# Patient Record
Sex: Male | Born: 2000 | Race: Black or African American | Hispanic: No | Marital: Single | Smoking: Never smoker
Health system: Southern US, Community
[De-identification: ages and names within clinical notes are randomized; demographics above are authoritative.]

## PROBLEM LIST (undated history)

## (undated) DIAGNOSIS — J45909 Unspecified asthma, uncomplicated: Secondary | ICD-10-CM

## (undated) HISTORY — PX: KNEE SURGERY: SHX244

---
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
## 2023-06-18 ENCOUNTER — Other Ambulatory Visit: Payer: Self-pay

## 2023-06-18 ENCOUNTER — Emergency Department (HOSPITAL_COMMUNITY)
Admission: EM | Admit: 2023-06-18 | Discharge: 2023-06-18 | Disposition: A | Discharge status: Home / Self Care | Payer: 59 | Attending: Student | Admitting: Student

## 2023-06-18 ENCOUNTER — Encounter (HOSPITAL_COMMUNITY): Payer: Self-pay

## 2023-06-18 DIAGNOSIS — W25XXXA Contact with sharp glass, initial encounter: Secondary | ICD-10-CM | POA: Diagnosis not present

## 2023-06-18 DIAGNOSIS — S61216A Laceration without foreign body of right little finger without damage to nail, initial encounter: Secondary | ICD-10-CM | POA: Insufficient documentation

## 2023-06-18 DIAGNOSIS — S6991XA Unspecified injury of right wrist, hand and finger(s), initial encounter: Secondary | ICD-10-CM | POA: Diagnosis present

## 2023-06-18 NOTE — ED Provider Notes (Signed)
[place] EMERGENCY DEPARTMENT AT [facility] Provider Note   CSN: [id number] Arrival date & time: 06/18/23  2101     History  Chief Complaint  Patient presents with   Laceration    Tim Hanson is a 22 y.o. male.  Patient presents to the emergency department complaining of laceration to the posterior right little finger.  Patient states he was pushing down some trash in his trash can, trying to compact it when he hit a piece of broken glass with his finger.  There are 2 small lacerations to the posterior proximal phalanx of the little finger.  Bleeding is controlled upon arrival.  The patient states he washed the wound with soap and water prior to coming to the hospital.  He states his tetanus is up-to-date.  He is right-hand dominant.  Past medical history significant for asthma.  HPI     Home Medications Prior to Admission medications   Not on File      Allergies    Amoxicillin    Review of Systems   Review of Systems  Physical Exam Updated Vital Signs BP 115/61 (BP Location: Left Arm)   Pulse 64   Temp 98.4 F (36.9 C) (Oral)   Resp 16   Ht 6\' 1"  (1.854 m)   Wt 75.8 kg   SpO2 100%   BMI 22.03 kg/m  Physical Exam Vitals and nursing note reviewed.  HENT:     Head: Normocephalic and atraumatic.  Eyes:     Pupils: Pupils are equal, round, and reactive to light.  Pulmonary:     Effort: Pulmonary effort is normal. No respiratory distress.  Musculoskeletal:        General: No signs of injury. Normal range of motion.     Cervical back: Normal range of motion.     Comments: Patient with normal active ROM of left little finger, no sensation loss, brisk cap refill  Skin:    General: Skin is dry.     Comments: 2 separate 1cm lacerations to posterior proximal phalanx of right little finger  Neurological:     Mental Status: He is alert.  Psychiatric:        Speech: Speech normal.        Behavior: Behavior normal.     ED Results / Procedures /  Treatments   Labs (all labs ordered are listed, but only abnormal results are displayed) Labs Reviewed - No data to display  EKG None  Radiology No results found.  Procedures .Marland KitchenLaceration Repair  Date/Time: 06/18/2023 11:19 PM  Performed by: Darrick Grinder, PA-C Authorized by: Darrick Grinder, PA-C   Consent:    Consent obtained:  Verbal   Consent given by:  Patient   Risks, benefits, and alternatives were discussed: yes     Risks discussed:  Poor wound healing, poor cosmetic result, pain, need for additional repair, nerve damage and infection Universal protocol:    Patient identity confirmed:  Verbally with patient Anesthesia:    Anesthesia method:  None Laceration details:    Location:  Finger   Finger location:  R small finger   Length (cm):  1   Depth (mm):  3 Treatment:    Area cleansed with:  Saline   Amount of cleaning:  Standard Skin repair:    Repair method:  Tissue adhesive Approximation:    Approximation:  Close Repair type:    Repair type:  Simple Post-procedure details:    Dressing:  Non-adherent dressing  Procedure completion:  Tolerated well, no immediate complications .Marland KitchenLaceration Repair  Date/Time: 06/18/2023 11:19 PM  Performed by: Darrick Grinder, PA-C Authorized by: Darrick Grinder, PA-C   Consent:    Consent obtained:  Verbal   Consent given by:  Patient   Risks, benefits, and alternatives were discussed: yes     Risks discussed:  Infection, pain, need for additional repair, poor cosmetic result, poor wound healing and nerve damage Universal protocol:    Patient identity confirmed:  Verbally with patient Anesthesia:    Anesthesia method:  None Laceration details:    Location:  Finger   Finger location:  R small finger   Length (cm):  1   Depth (mm):  3 Treatment:    Area cleansed with:  Saline   Amount of cleaning:  Standard Skin repair:    Repair method:  Tissue adhesive Approximation:    Approximation:  Close Repair  type:    Repair type:  Simple Post-procedure details:    Dressing:  Non-adherent dressing   Procedure completion:  Tolerated well, no immediate complications     Medications Ordered in ED Medications - No data to display  ED Course/ Medical Decision Making/ A&P                             Medical Decision Making  Patient presents to the emergency room with a chief complaint of lacerations.  The patient has full normal range of motion of the small finger of the right hand.  I see no indication at this time for imaging.  Very low clinical suspicion of foreign body.  Patient's lacerations were cleaned with saline and closed with surgical adhesive as noted in procedure notes. Patient provided with wound care instructions. No follow up is required. Return precautions provided.         Final Clinical Impression(s) / ED Diagnoses Final diagnoses:  Laceration of right little finger without foreign body without damage to nail, initial encounter    Rx / DC Orders ED Discharge Orders     None         Pamala Duffel 06/18/23 2339    Glendora Score, MD 06/19/23 1249

## 2023-06-18 NOTE — Discharge Instructions (Signed)
You were seen tonight for laceration repair of the small finger of your right hand.  The lacerations were repaired with surgical glue.  You should allow this to fall off on its own over the next days to weeks.  Please avoid getting the wound wet until tomorrow evening.  After that time you may get the wound wet.  If you notice any signs of infection please seek reevaluation.

## 2023-06-18 NOTE — ED Triage Notes (Signed)
Pt cut the proximal joint of the pinky finger on the rt hand on a glass in the trashcan tonight.  Bleeding is controlled. 1 cm lateral laceration.well approximated.

## 2024-06-29 ENCOUNTER — Emergency Department (HOSPITAL_COMMUNITY)
Admission: EM | Admit: 2024-06-29 | Discharge: 2024-06-29 | Disposition: A | Discharge status: Home / Self Care | Attending: Emergency Medicine | Admitting: Emergency Medicine

## 2024-06-29 ENCOUNTER — Other Ambulatory Visit: Payer: Self-pay

## 2024-06-29 ENCOUNTER — Encounter (HOSPITAL_COMMUNITY): Payer: Self-pay | Admitting: *Deleted

## 2024-06-29 DIAGNOSIS — Z202 Contact with and (suspected) exposure to infections with a predominantly sexual mode of transmission: Secondary | ICD-10-CM | POA: Insufficient documentation

## 2024-06-29 DIAGNOSIS — R8271 Bacteriuria: Secondary | ICD-10-CM | POA: Insufficient documentation

## 2024-06-29 DIAGNOSIS — Z711 Person with feared health complaint in whom no diagnosis is made: Secondary | ICD-10-CM

## 2024-06-29 LAB — URINALYSIS, ROUTINE W REFLEX MICROSCOPIC
Bilirubin Urine: NEGATIVE
Glucose, UA: NEGATIVE mg/dL
Hgb urine dipstick: NEGATIVE
Ketones, ur: 5 mg/dL — AB
Nitrite: NEGATIVE
Protein, ur: NEGATIVE mg/dL
Specific Gravity, Urine: 1.021 (ref 1.005–1.030)
pH: 6 (ref 5.0–8.0)

## 2024-06-29 LAB — HIV ANTIBODY (ROUTINE TESTING W REFLEX): HIV Screen 4th Generation wRfx: NONREACTIVE

## 2024-06-29 NOTE — ED Provider Notes (Signed)
  [place] EMERGENCY DEPARTMENT AT [facility] Provider Note   CSN: [id number] Arrival date & time: 06/29/24  2025     Patient presents with: Exposure to STD   Tim Hanson is a 23 y.o. male.  {Add pertinent medical, surgical, social history, OB history to HPI:32947} HPI     Prior to Admission medications   Not on File    Allergies: Amoxicillin    Review of Systems  Updated Vital Signs BP 136/67 (BP Location: Right Arm)   Pulse 89   Temp 98.4 F (36.9 C) (Oral)   Resp 16   Ht 6' 1 (1.854 m)   SpO2 99%   BMI 22.03 kg/m   Physical Exam  (all labs ordered are listed, but only abnormal results are displayed) Labs Reviewed  URINALYSIS, ROUTINE W REFLEX MICROSCOPIC - Abnormal; Notable for the following components:      Result Value   Ketones, ur 5 (*)    Leukocytes,Ua SMALL (*)    Bacteria, UA FEW (*)    All other components within normal limits  URINE CULTURE  RPR  HIV ANTIBODY (ROUTINE TESTING W REFLEX)  GC/CHLAMYDIA PROBE AMP ([place]) NOT AT [facility]    EKG: None  Radiology: No results found.  {Document cardiac monitor, telemetry assessment procedure when appropriate:32947} Procedures   Medications Ordered in the ED - No data to display    {Click here for ABCD2, HEART and other calculators REFRESH Note before signing:1}                              Medical Decision Making Amount and/or Complexity of Data Reviewed Labs: ordered.   ***  {Document critical care time when appropriate  Document review of labs and clinical decision tools ie CHADS2VASC2, etc  Document your independent review of radiology images and any outside records  Document your discussion with family members, caretakers and with consultants  Document social determinants of health affecting pt's care  Document your decision making why or why not admission, treatments were needed:32947:::1}   Final diagnoses:  None    ED Discharge Orders     None

## 2024-06-29 NOTE — ED Triage Notes (Signed)
 The pt has no symptoms but he just wants to be tested for a std

## 2024-06-30 LAB — RPR: RPR Ser Ql: NONREACTIVE

## 2024-07-01 LAB — URINE CULTURE: Culture: NO GROWTH

## 2024-07-01 LAB — GC/CHLAMYDIA PROBE AMP (~~LOC~~) NOT AT ARMC
Chlamydia: POSITIVE — AB
Comment: NEGATIVE
Comment: NORMAL
Neisseria Gonorrhea: NEGATIVE

## 2024-07-02 ENCOUNTER — Encounter (HOSPITAL_COMMUNITY): Payer: Self-pay

## 2024-07-02 ENCOUNTER — Ambulatory Visit (HOSPITAL_COMMUNITY)
Admission: EM | Admit: 2024-07-02 | Discharge: 2024-07-02 | Disposition: A | Discharge status: Home / Self Care | Attending: Emergency Medicine | Admitting: Emergency Medicine

## 2024-07-02 DIAGNOSIS — A749 Chlamydial infection, unspecified: Secondary | ICD-10-CM

## 2024-07-02 MED ORDER — DOXYCYCLINE HYCLATE 100 MG PO CAPS: 100.0000 mg | ORAL_CAPSULE | Freq: Two times a day (BID) | ORAL | 0 refills | Status: AC

## 2024-07-02 NOTE — ED Triage Notes (Signed)
 Patient states he tested positive for chlamydia and would like to be treated.

## 2024-07-02 NOTE — ED Provider Notes (Signed)
 [facility]    CSN: [id number] Arrival date & time: 07/02/24  1151      History   Chief Complaint Chief Complaint  Patient presents with   Exposure to STD    HPI Tim Hanson is a 23 y.o. male.  Went to the ED 3 days ago on 8/2.  He was having some penile discharge and wanted STD testing. Yesterday his results returned and he is positive for chlamydia. He is seeking treatment  Past Medical History:  Diagnosis Date   Asthma     There are no active problems to display for this patient.   History reviewed. No pertinent surgical history.     Home Medications    Prior to Admission medications   Medication Sig Start Date End Date Taking? Authorizing Provider  doxycycline  (VIBRAMYCIN ) 100 MG capsule Take 1 capsule (100 mg total) by mouth 2 (two) times daily for 7 days. 07/02/24 07/09/24 Yes Momina Hunton, [place] RIGGERS    Family History History reviewed. No pertinent family history.  Social History Social History   Tobacco Use   Smoking status: Never    Passive exposure: Never   Smokeless tobacco: Never  Vaping Use   Vaping status: Never Used  Substance Use Topics   Alcohol use: Yes   Drug use: Yes    Types: Marijuana     Allergies   Amoxicillin   Review of Systems Review of Systems  As per HPI  Physical Exam Triage Vital Signs ED Triage Vitals  Encounter Vitals Group     BP      Girls Systolic BP Percentile      Girls Diastolic BP Percentile      Boys Systolic BP Percentile      Boys Diastolic BP Percentile      Pulse      Resp      Temp      Temp src      SpO2      Weight      Height      Head Circumference      Peak Flow      Pain Score      Pain Loc      Pain Education      Exclude from Growth Chart    No data found.  Updated Vital Signs BP 128/74 (BP Location: Left Arm)   Pulse (!) 108   Temp 98.3 F (36.8 C) (Oral)   Resp 16   SpO2 98%    Physical Exam Vitals and nursing note reviewed.  Constitutional:       General: He is not in acute distress.    Appearance: Normal appearance.  HENT:     Mouth/Throat:     Pharynx: Oropharynx is clear.  Cardiovascular:     Rate and Rhythm: Normal rate and regular rhythm.     Heart sounds: Normal heart sounds.  Pulmonary:     Effort: Pulmonary effort is normal.     Breath sounds: Normal breath sounds.  Neurological:     Mental Status: He is alert and oriented to person, place, and time.     UC Treatments / Results  Labs (all labs ordered are listed, but only abnormal results are displayed) Labs Reviewed - No data to display  EKG  Radiology No results found.  Procedures Procedures   Medications Ordered in UC Medications - No data to display  Initial Impression / Assessment and Plan / UC Course  I have reviewed the  triage vital signs and the nursing notes.  Pertinent labs & imaging results that were available during my care of the patient were reviewed by me and considered in my medical decision making (see chart for details).  Positive chlamydia test Discussed treatment with doxycycline  BID x 7 days. Safe sex practices, abstaining from intercourse for 7 days after finishing treatment. Discussed no re-testing is needed as long as he follows these instructions. Patient verbalizes understanding   Final Clinical Impressions(s) / UC Diagnoses   Final diagnoses:  Chlamydia     Discharge Instructions      As discussed, chlamydia is treated with an antibiotic called doxycycline  You will need to take 1 pill, 2 times daily, for 7 days in a row Finish ALL of the pills - you should not have any left over! Take with food to avoid upset stomach.  Abstain from intercourse for a full 7 days after finishing treatment. This is to make sure you do not spread the infection or get reinfected yourself.  There is no re-testing needed if you follow these instructions.      ED Prescriptions     Medication Sig Dispense Auth. Provider    doxycycline  (VIBRAMYCIN ) 100 MG capsule Take 1 capsule (100 mg total) by mouth 2 (two) times daily for 7 days. 14 capsule Daliah Chaudoin, [place], PA-C      PDMP not reviewed this encounter.   Cherryl Babin, [place], [place] 07/02/24 1226

## 2024-07-02 NOTE — Discharge Instructions (Addendum)
 As discussed, chlamydia is treated with an antibiotic called doxycycline  You will need to take 1 pill, 2 times daily, for 7 days in a row Finish ALL of the pills - you should not have any left over! Take with food to avoid upset stomach.  Abstain from intercourse for a full 7 days after finishing treatment. This is to make sure you do not spread the infection or get reinfected yourself.  There is no re-testing needed if you follow these instructions.

## 2024-07-03 ENCOUNTER — Ambulatory Visit (HOSPITAL_COMMUNITY): Payer: Self-pay

## 2024-07-30 ENCOUNTER — Encounter (HOSPITAL_COMMUNITY): Payer: Self-pay

## 2024-07-30 ENCOUNTER — Ambulatory Visit (HOSPITAL_COMMUNITY): Admission: EM | Admit: 2024-07-30 | Discharge: 2024-07-30 | Disposition: A | Discharge status: Home / Self Care

## 2024-07-30 NOTE — ED Notes (Signed)
 Pt states he was tested and treated for chlamydia on 07/02/2024, he wants to make sure he is neg now.

## 2024-07-30 NOTE — ED Triage Notes (Signed)
 Pt states wants routine STD check with blood work. Denies sx's.

## 2024-07-30 NOTE — ED Notes (Signed)
 Pt decided to leave and come back next week when it has been 4 week from last dose of meds.

## 2024-08-14 ENCOUNTER — Other Ambulatory Visit: Payer: Self-pay

## 2024-08-14 ENCOUNTER — Encounter (HOSPITAL_COMMUNITY): Payer: Self-pay | Admitting: Emergency Medicine

## 2024-08-14 ENCOUNTER — Ambulatory Visit (HOSPITAL_COMMUNITY): Admission: EM | Admit: 2024-08-14 | Discharge: 2024-08-14 | Discharge status: Against Medical Advice

## 2024-08-14 NOTE — ED Triage Notes (Signed)
 Patient is in department for a check up denies any symptoms

## 2024-08-15 ENCOUNTER — Ambulatory Visit (HOSPITAL_COMMUNITY)
Admission: EM | Admit: 2024-08-15 | Discharge: 2024-08-15 | Disposition: A | Discharge status: Home / Self Care | Attending: Emergency Medicine | Admitting: Emergency Medicine

## 2024-08-15 ENCOUNTER — Encounter (HOSPITAL_COMMUNITY): Payer: Self-pay | Admitting: Emergency Medicine

## 2024-08-15 ENCOUNTER — Ambulatory Visit (HOSPITAL_COMMUNITY)

## 2024-08-15 DIAGNOSIS — Z113 Encounter for screening for infections with a predominantly sexual mode of transmission: Secondary | ICD-10-CM | POA: Diagnosis present

## 2024-08-15 LAB — HIV ANTIBODY (ROUTINE TESTING W REFLEX): HIV Screen 4th Generation wRfx: NONREACTIVE

## 2024-08-15 NOTE — Discharge Instructions (Signed)
 Your results will come back over the next few days and someone will call if results are positive and require treatment.  Return here as needed.

## 2024-08-15 NOTE — ED Provider Notes (Signed)
 [facility]    CSN: [id number] Arrival date & time: 08/15/24  1017      History   Chief Complaint Chief Complaint  Patient presents with   appt 1030    HPI Tim Hanson is a 23 y.o. male.   Patient presents requesting STD testing.  Patient reports that he was recently treated for chlamydia and completed treatment about 4 weeks ago would like retesting for this.  Patient is also requesting repeat HIV and syphilis testing.  Patient denies any recent known exposures to STDs.  Patient denies any penile discharge, dysuria, hematuria, urinary frequency/urgency, penile/testicular pain or swelling, and penile lesions.  The history is provided by the patient and medical records.    Past Medical History:  Diagnosis Date   Asthma     There are no active problems to display for this patient.   Past Surgical History:  Procedure Laterality Date   KNEE SURGERY         Home Medications    Prior to Admission medications   Not on File    Family History No family history on file.  Social History Social History   Tobacco Use   Smoking status: Never    Passive exposure: Never   Smokeless tobacco: Never  Vaping Use   Vaping status: Never Used  Substance Use Topics   Alcohol use: Yes   Drug use: Yes    Types: Marijuana     Allergies   Amoxicillin   Review of Systems Review of Systems  Per HPI  Physical Exam Triage Vital Signs ED Triage Vitals [08/15/24 1040]  Encounter Vitals Group     BP (!) 95/59     Girls Systolic BP Percentile      Girls Diastolic BP Percentile      Boys Systolic BP Percentile      Boys Diastolic BP Percentile      Pulse Rate 60     Resp 14     Temp 98 F (36.7 C)     Temp Source Oral     SpO2 98 %     Weight      Height      Head Circumference      Peak Flow      Pain Score 0     Pain Loc      Pain Education      Exclude from Growth Chart    No data found.  Updated Vital Signs BP (!) 95/59 (BP  Location: Left Arm)   Pulse 60   Temp 98 F (36.7 C) (Oral)   Resp 14   SpO2 98%   Visual Acuity Right Eye Distance:   Left Eye Distance:   Bilateral Distance:    Right Eye Near:   Left Eye Near:    Bilateral Near:     Physical Exam Vitals and nursing note reviewed.  Constitutional:      General: He is awake. He is not in acute distress.    Appearance: Normal appearance. He is well-developed and well-groomed. He is not ill-appearing.  Neurological:     Mental Status: He is alert.  Psychiatric:        Behavior: Behavior is cooperative.      UC Treatments / Results  Labs (all labs ordered are listed, but only abnormal results are displayed) Labs Reviewed  HIV ANTIBODY (ROUTINE TESTING W REFLEX)  RPR  CYTOLOGY, (ORAL, ANAL, URETHRAL) ANCILLARY ONLY    EKG   Radiology No  results found.  Procedures Procedures (including critical care time)  Medications Ordered in UC Medications - No data to display  Initial Impression / Assessment and Plan / UC Course  I have reviewed the triage vital signs and the nursing notes.  Pertinent labs & imaging results that were available during my care of the patient were reviewed by me and considered in my medical decision making (see chart for details).     Patient is overall well-appearing.  Vitals are stable.  GU exam deferred.  Patient performed self swab for STD.  HIV and RPR ordered.  Discussed follow-up and return precautions. Final Clinical Impressions(s) / UC Diagnoses   Final diagnoses:  Screen for STD (sexually transmitted disease)     Discharge Instructions      Your results will come back over the next few days and someone will call if results are positive and require treatment.  Return here as needed.    ED Prescriptions   None    PDMP not reviewed this encounter.   Johnie Flaming A, NP 08/15/24 1116

## 2024-08-15 NOTE — ED Triage Notes (Signed)
 Pt reports that he wanting to get retested for STD to make sure that he is negative. Reports took medications recently for an STD.

## 2024-08-16 LAB — CYTOLOGY, (ORAL, ANAL, URETHRAL) ANCILLARY ONLY
Chlamydia: NEGATIVE
Comment: NEGATIVE
Comment: NEGATIVE
Comment: NORMAL
Neisseria Gonorrhea: NEGATIVE
Trichomonas: NEGATIVE

## 2024-08-16 LAB — RPR: RPR Ser Ql: NONREACTIVE
